# Patient Record
Sex: Male | Born: 1975 | Race: White | Hispanic: No | Smoking: Never smoker
Health system: Southern US, Community
[De-identification: ages and names within clinical notes are randomized; demographics above are authoritative.]

## PROBLEM LIST (undated history)

## (undated) DIAGNOSIS — Z86718 Personal history of other venous thrombosis and embolism: Secondary | ICD-10-CM

## (undated) DIAGNOSIS — I2699 Other pulmonary embolism without acute cor pulmonale: Secondary | ICD-10-CM

## (undated) DIAGNOSIS — D649 Anemia, unspecified: Secondary | ICD-10-CM

---

## 2013-12-30 ENCOUNTER — Emergency Department (HOSPITAL_COMMUNITY)
Admission: EM | Admit: 2013-12-30 | Discharge: 2013-12-31 | Disposition: A | Discharge status: Home / Self Care | Payer: Self-pay | Attending: Emergency Medicine | Admitting: Emergency Medicine

## 2013-12-30 ENCOUNTER — Encounter (HOSPITAL_COMMUNITY): Payer: Self-pay | Admitting: Emergency Medicine

## 2013-12-30 DIAGNOSIS — Z86718 Personal history of other venous thrombosis and embolism: Secondary | ICD-10-CM | POA: Insufficient documentation

## 2013-12-30 DIAGNOSIS — M79609 Pain in unspecified limb: Secondary | ICD-10-CM | POA: Insufficient documentation

## 2013-12-30 DIAGNOSIS — M79606 Pain in leg, unspecified: Secondary | ICD-10-CM

## 2013-12-30 DIAGNOSIS — Z862 Personal history of diseases of the blood and blood-forming organs and certain disorders involving the immune mechanism: Secondary | ICD-10-CM | POA: Insufficient documentation

## 2013-12-30 HISTORY — DX: Personal history of other venous thrombosis and embolism: Z86.718

## 2013-12-30 HISTORY — DX: Anemia, unspecified: D64.9

## 2013-12-30 LAB — CBC WITH DIFFERENTIAL/PLATELET
BASOS PCT: 0 % (ref 0–1)
Basophils Absolute: 0 10*3/uL (ref 0.0–0.1)
EOS PCT: 3 % (ref 0–5)
Eosinophils Absolute: 0.2 10*3/uL (ref 0.0–0.7)
HCT: 44.5 % (ref 39.0–52.0)
Hemoglobin: 15.4 g/dL (ref 13.0–17.0)
Lymphocytes Relative: 13 % (ref 12–46)
Lymphs Abs: 1.2 10*3/uL (ref 0.7–4.0)
MCH: 30 pg (ref 26.0–34.0)
MCHC: 34.6 g/dL (ref 30.0–36.0)
MCV: 86.6 fL (ref 78.0–100.0)
MONO ABS: 0.5 10*3/uL (ref 0.1–1.0)
Monocytes Relative: 6 % (ref 3–12)
NEUTROS ABS: 6.9 10*3/uL (ref 1.7–7.7)
Neutrophils Relative %: 78 % — ABNORMAL HIGH (ref 43–77)
PLATELETS: 301 10*3/uL (ref 150–400)
RBC: 5.14 MIL/uL (ref 4.22–5.81)
RDW: 13.2 % (ref 11.5–15.5)
WBC: 8.8 10*3/uL (ref 4.0–10.5)

## 2013-12-30 LAB — COMPREHENSIVE METABOLIC PANEL
ALBUMIN: 4 g/dL (ref 3.5–5.2)
ALT: 20 U/L (ref 0–53)
AST: 21 U/L (ref 0–37)
Alkaline Phosphatase: 76 U/L (ref 39–117)
BUN: 8 mg/dL (ref 6–23)
CO2: 27 mEq/L (ref 19–32)
CREATININE: 0.98 mg/dL (ref 0.50–1.35)
Calcium: 9.3 mg/dL (ref 8.4–10.5)
Chloride: 98 mEq/L (ref 96–112)
GFR calc Af Amer: >90 mL/min (ref >90)
GFR calc non Af Amer: >90 mL/min (ref >90)
Glucose, Bld: 101 mg/dL — ABNORMAL HIGH (ref 70–99)
Potassium: 4.2 mEq/L (ref 3.7–5.3)
SODIUM: 136 meq/L — AB (ref 137–147)
TOTAL PROTEIN: 7.1 g/dL (ref 6.0–8.3)
Total Bilirubin: 0.4 mg/dL (ref 0.3–1.2)

## 2013-12-30 LAB — PROTIME-INR
INR: 1.94 — AB (ref 0.00–1.49)
Prothrombin Time: 21.6 seconds — ABNORMAL HIGH (ref 11.6–15.2)

## 2013-12-30 MED ORDER — ENOXAPARIN SODIUM 120 MG/0.8ML ~~LOC~~ SOLN
SUBCUTANEOUS | Status: AC
  Filled 2013-12-30: qty 0.8

## 2013-12-30 MED ORDER — WARFARIN - PHYSICIAN DOSING INPATIENT: Freq: Every day | Status: DC

## 2013-12-30 MED ORDER — ENOXAPARIN SODIUM 120 MG/0.8ML ~~LOC~~ SOLN
1.0000 mg/kg | Freq: Once | SUBCUTANEOUS | Status: AC
  Administered 2013-12-31: via SUBCUTANEOUS

## 2013-12-30 MED ORDER — WARFARIN SODIUM 5 MG PO TABS
ORAL_TABLET | ORAL | Status: AC
  Filled 2013-12-30: qty 1

## 2013-12-30 MED ORDER — WARFARIN SODIUM 5 MG PO TABS
5.0000 mg | ORAL_TABLET | Freq: Once | ORAL | Status: AC
  Administered 2013-12-31: 5 mg via ORAL
  Filled 2013-12-30: qty 1

## 2013-12-30 NOTE — Discharge Instructions (Signed)
Follow up tomorrow  For ultrasound of leg.  Follow up with your md next week.

## 2013-12-30 NOTE — ED Provider Notes (Signed)
CSN: [id number]     Arrival date & time 12/30/13  2014 History  This chart was scribed for Tanner LennertJoseph L Elmore Hyslop, MD by Evon Slackerrance Branch, ED Scribe. This patient was seen in room APA14/APA14 and the patient's care was started at 9:04 PM.    Chief Complaint  Patient presents with  . Leg Swelling   Patient is a 38 y.o. male presenting with leg pain. The history is provided by the patient. No language interpreter was used.  Leg Pain Location:  Leg Injury: no   Leg location:  L lower leg (left calf) Pain details:    Quality:  Unable to specify   Radiates to:  Does not radiate   Severity:  Moderate   Onset quality:  Gradual   Duration:  1 week   Timing:  Constant   Progression:  Worsening Chronicity:  New Dislocation: no   Prior injury to area:  No Relieved by:  None tried Worsened by:  Nothing tried Ineffective treatments:  None tried Associated symptoms: swelling   Associated symptoms: no back pain and no fatigue   Risk factors comment:  H/o DVT works as [employer] architecttruck driver  HPI Comments: Tanner Boyd is a 38 y.o. male who presents to the Emergency Department complaining of left leg swelling for 1 week. 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 Pt denies any symptoms of SOB. Pt also states he has been  prescribed warfarin. He is is a current user of chewing tobacco.   Past Medical History  Diagnosis Date  . History of blood clots   . Anemia    History reviewed. No pertinent past surgical history. History reviewed. No pertinent family history. History  Substance Use Topics  . Smoking status: Never Smoker   . Smokeless tobacco: Current User    Types: Chew  . Alcohol Use: No    Review of Systems  Constitutional: Negative for appetite change and fatigue.  HENT: Negative for congestion, ear discharge and sinus pressure.   Eyes: Negative for discharge.  Respiratory: Negative for cough and shortness of breath.   Cardiovascular: Positive for leg  swelling (left). Negative for chest pain.  Gastrointestinal: Negative for abdominal pain and diarrhea.  Genitourinary: Negative for frequency and hematuria.  Musculoskeletal: Negative for back pain.  Skin: Negative for rash.  Neurological: Negative for seizures and headaches.  Psychiatric/Behavioral: Negative for hallucinations.      Allergies  Review of patient's allergies indicates no known allergies.  Home Medications   Prior to Admission medications   Not on File   Triage Vitals: BP 139/91  Pulse 78  Temp(Src) 98.2 F (36.8 C)  Resp 20  Ht 5\' 9"  (1.753 m)  Wt 240 lb (108.863 kg)  BMI 35.43 kg/m2  SpO2 100%  Physical Exam  Nursing note and vitals reviewed. Constitutional: He is oriented to person, place, and time. He appears well-developed.  HENT:  Head: Normocephalic.  Eyes: Conjunctivae and EOM are normal. No scleral icterus.  Neck: Neck supple. No thyromegaly present.  Cardiovascular: Normal rate and regular rhythm.  Exam reveals no gallop and no friction rub.   No murmur heard. Pulmonary/Chest: No stridor. He has no wheezes. He has no rales. He exhibits no tenderness.  Abdominal: He exhibits no distension. There is no tenderness. There is no rebound.  Musculoskeletal: Normal range of motion. He exhibits tenderness.  Moderate left calf tenderness, and mild swelling   Lymphadenopathy:    He has  no cervical adenopathy.  Neurological: He is oriented to person, place, and time. He exhibits normal muscle tone.  Skin: No rash noted. No erythema.  Psychiatric: He has a normal mood and affect. His behavior is normal.    ED Course  Procedures (including critical care time) DIAGNOSTIC STUDIES: Oxygen Saturation is 100% on RA, normal by my interpretation.    COORDINATION OF CARE: 9:09 PM-Discussed treatment plan which includes CBC panel, CMP, and INR with pt at bedside and pt agreed to plan.     Labs Review Labs Reviewed  CBC WITH DIFFERENTIAL - Abnormal;  Notable for the following:    Neutrophils Relative % 78 (*)    All other components within normal limits  COMPREHENSIVE METABOLIC PANEL - Abnormal; Notable for the following:    Sodium 136 (*)    Glucose, Bld 101 (*)    All other components within normal limits  PROTIME-INR - Abnormal; Notable for the following:    Prothrombin Time 21.6 (*)    INR 1.94 (*)    All other components within normal limits    Imaging Review No results found.   EKG Interpretation None      MDM   Final diagnoses:  Leg pain   The chart was scribed for me under my direct supervision.  I personally performed the history, physical, and medical decision making and all procedures in the evaluation of this patient.Tanner Lennert.    Edoardo Laforte L Hanny Elsberry, MD 12/31/13 [phone]

## 2013-12-30 NOTE — ED Notes (Signed)
Pt c/o swelling to left calf, warmth to left calf, and pain. Pt has had blood clots in the past.

## 2013-12-31 ENCOUNTER — Ambulatory Visit (HOSPITAL_COMMUNITY)
Admit: 2013-12-31 | Discharge: 2013-12-31 | Disposition: A | Discharge status: Home / Self Care | Payer: Self-pay | Source: Ambulatory Visit | Attending: Emergency Medicine | Admitting: Emergency Medicine

## 2013-12-31 DIAGNOSIS — M79609 Pain in unspecified limb: Secondary | ICD-10-CM | POA: Insufficient documentation

## 2013-12-31 NOTE — ED Provider Notes (Signed)
Doppler left lower extremity negative for DVT. Discussed with patient and his cousin  Donnetta HutchingBrian Winston Sobczyk, MD 12/31/13 [phone]

## 2014-01-31 ENCOUNTER — Encounter (HOSPITAL_COMMUNITY): Payer: Self-pay | Admitting: Emergency Medicine

## 2014-01-31 ENCOUNTER — Emergency Department (HOSPITAL_COMMUNITY)
Admission: EM | Admit: 2014-01-31 | Discharge: 2014-01-31 | Disposition: A | Discharge status: Home / Self Care | Payer: Self-pay | Attending: Emergency Medicine | Admitting: Emergency Medicine

## 2014-01-31 ENCOUNTER — Emergency Department (HOSPITAL_COMMUNITY): Payer: Self-pay

## 2014-01-31 DIAGNOSIS — Z862 Personal history of diseases of the blood and blood-forming organs and certain disorders involving the immune mechanism: Secondary | ICD-10-CM | POA: Insufficient documentation

## 2014-01-31 DIAGNOSIS — M549 Dorsalgia, unspecified: Secondary | ICD-10-CM | POA: Insufficient documentation

## 2014-01-31 DIAGNOSIS — R0600 Dyspnea, unspecified: Secondary | ICD-10-CM

## 2014-01-31 DIAGNOSIS — R0609 Other forms of dyspnea: Secondary | ICD-10-CM | POA: Insufficient documentation

## 2014-01-31 DIAGNOSIS — Z7901 Long term (current) use of anticoagulants: Secondary | ICD-10-CM | POA: Insufficient documentation

## 2014-01-31 DIAGNOSIS — R059 Cough, unspecified: Secondary | ICD-10-CM | POA: Insufficient documentation

## 2014-01-31 DIAGNOSIS — R05 Cough: Secondary | ICD-10-CM | POA: Insufficient documentation

## 2014-01-31 DIAGNOSIS — Z86711 Personal history of pulmonary embolism: Secondary | ICD-10-CM | POA: Insufficient documentation

## 2014-01-31 DIAGNOSIS — R0989 Other specified symptoms and signs involving the circulatory and respiratory systems: Principal | ICD-10-CM | POA: Insufficient documentation

## 2014-01-31 HISTORY — DX: Other pulmonary embolism without acute cor pulmonale: I26.99

## 2014-01-31 LAB — BASIC METABOLIC PANEL
BUN: 11 mg/dL (ref 6–23)
CO2: 29 meq/L (ref 19–32)
CREATININE: 1.04 mg/dL (ref 0.50–1.35)
Calcium: 9.2 mg/dL (ref 8.4–10.5)
Chloride: 100 mEq/L (ref 96–112)
GFR calc Af Amer: >90 mL/min (ref >90)
GFR calc non Af Amer: 88 mL/min — ABNORMAL LOW (ref >90)
GLUCOSE: 96 mg/dL (ref 70–99)
Potassium: 4.5 mEq/L (ref 3.7–5.3)
Sodium: 140 mEq/L (ref 137–147)

## 2014-01-31 LAB — CBC WITH DIFFERENTIAL/PLATELET
BASOS ABS: 0 10*3/uL (ref 0.0–0.1)
Basophils Relative: 0 % (ref 0–1)
Eosinophils Absolute: 0.3 10*3/uL (ref 0.0–0.7)
Eosinophils Relative: 5 % (ref 0–5)
HCT: 43.8 % (ref 39.0–52.0)
Hemoglobin: 14.8 g/dL (ref 13.0–17.0)
LYMPHS ABS: 1 10*3/uL (ref 0.7–4.0)
LYMPHS PCT: 17 % (ref 12–46)
MCH: 29.5 pg (ref 26.0–34.0)
MCHC: 33.8 g/dL (ref 30.0–36.0)
MCV: 87.3 fL (ref 78.0–100.0)
MONO ABS: 0.4 10*3/uL (ref 0.1–1.0)
Monocytes Relative: 8 % (ref 3–12)
NEUTROS ABS: 4.1 10*3/uL (ref 1.7–7.7)
Neutrophils Relative %: 70 % (ref 43–77)
Platelets: 261 10*3/uL (ref 150–400)
RBC: 5.02 MIL/uL (ref 4.22–5.81)
RDW: 13.2 % (ref 11.5–15.5)
WBC: 5.8 10*3/uL (ref 4.0–10.5)

## 2014-01-31 LAB — PROTIME-INR
INR: 2.2 — AB (ref 0.00–1.49)
Prothrombin Time: 23.7 seconds — ABNORMAL HIGH (ref 11.6–15.2)

## 2014-01-31 LAB — TROPONIN I: Troponin I: <0.3 ng/mL (ref <0.30)

## 2014-01-31 LAB — D-DIMER, QUANTITATIVE (NOT AT ARMC): D-Dimer, Quant: <0.27 ug/mL-FEU (ref 0.00–0.48)

## 2014-01-31 MED ORDER — PREDNISONE 20 MG PO TABS: ORAL_TABLET | ORAL | Status: AC

## 2014-01-31 MED ORDER — ALBUTEROL SULFATE HFA 108 (90 BASE) MCG/ACT IN AERS
2.0000 | INHALATION_SPRAY | Freq: Once | RESPIRATORY_TRACT | Status: AC
  Administered 2014-01-31: 2 via RESPIRATORY_TRACT
  Filled 2014-01-31: qty 6.7

## 2014-01-31 MED ORDER — PREDNISONE 50 MG PO TABS
60.0000 mg | ORAL_TABLET | Freq: Once | ORAL | Status: AC
  Administered 2014-01-31: 60 mg via ORAL
  Filled 2014-01-31 (×2): qty 1

## 2014-01-31 NOTE — ED Notes (Signed)
Pt c/o sob and cough x 1 week.  Pt says gets extremely sob with exertion and reports "little" chest pain.   C/O lower back pain x 2 or 3 weeks as well.   Unknown if has had fever.  Reports history of PE.

## 2014-01-31 NOTE — Care Management Note (Signed)
ED/CM noted patient did not have health insurance and/or PCP listed in the computer.  Patient was given the [place] resource handout with information on the clinics, food pantries, and the handout for new health insurance sign-up.  Patient expressed appreciation for information received. 

## 2014-01-31 NOTE — ED Provider Notes (Signed)
CSN: [id number]     Arrival date & time 01/31/14  1215 History  This chart was scribed for Donnetta HutchingBrian Gwenevere Goga, MD by Ardelia Memsylan Malpass, ED Scribe. This patient was seen in room APA05/APA05 and the patient's care was started at 1:29 PM.   Chief Complaint  Patient presents with  . Shortness of Breath  . Cough    The history is provided by the patient. No language interpreter was used.    HPI Comments: Tanner JunglingDavid Boyd is a 38 y.o. Male with a history of PE/DVTwho presents to the Emergency Department complaining of intermittent, moderate SOB over the past week. He states that his SOB is worsened with exertion. He reports associated intermittent cough, chest tightness and upper back pain over the past week. He states that he had a PE and DVT in August 2014, and that his current symptoms feel similar. He states that he has been taking Coumadin daily since August of 2014. He denies any other significant PMH other than PE/DVT. He denies any known family history of heart disease. He denies leg pain/swelling, or any other pain or symptoms.   Past Medical History  Diagnosis Date  . History of blood clots   . Anemia   . PE (pulmonary embolism)    History reviewed. No pertinent past surgical history. No family history on file. History  Substance Use Topics  . Smoking status: Never Smoker   . Smokeless tobacco: Current User    Types: Chew  . Alcohol Use: No    Review of Systems A complete 10 system review of systems was obtained and all systems are negative except as noted in the HPI and PMH.   Allergies  Review of patient's allergies indicates no known allergies.  Home Medications   Prior to Admission medications   Medication Sig Start Date End Date Taking? Authorizing Provider  warfarin (COUMADIN) 5 MG tablet Take 5 mg by mouth daily.    Historical Provider, MD   Triage Vitals: BP 136/92  Pulse 85  Temp(Src) 97.9 F (36.6 C) (Oral)  Resp 18  SpO2 98%  Physical Exam  Nursing note and vitals  reviewed. Constitutional: He is oriented to person, place, and time. He appears well-developed and well-nourished.  HENT:  Head: Normocephalic and atraumatic.  Eyes: Conjunctivae and EOM are normal. Pupils are equal, round, and reactive to light.  Neck: Normal range of motion. Neck supple.  Cardiovascular: Normal rate, regular rhythm and normal heart sounds.   Pulmonary/Chest: Effort normal and breath sounds normal.  Abdominal: Soft. Bowel sounds are normal.  Musculoskeletal: Normal range of motion.  Neurological: He is alert and oriented to person, place, and time.  Skin: Skin is warm and dry.  Psychiatric: He has a normal mood and affect. His behavior is normal.    ED Course  Procedures (including critical care time)  DIAGNOSTIC STUDIES: Oxygen Saturation is 98% on RA, normal by my interpretation.    COORDINATION OF CARE: 1:32 PM- Discussed plan to obtain a CXR and blood work. Pt advised of plan for treatment and pt agrees.  Results for orders placed during the hospital encounter of 01/31/14  CBC WITH DIFFERENTIAL      Result Value Ref Range   WBC 5.8  4.0 - 10.5 K/uL   RBC 5.02  4.22 - 5.81 MIL/uL   Hemoglobin 14.8  13.0 - 17.0 g/dL   HCT 40.943.8  81.139.0 - 91.452.0 %   MCV 87.3  78.0 - 100.0 fL   MCH 29.5  26.0 -  34.0 pg   MCHC 33.8  30.0 - 36.0 g/dL   RDW 78.2  95.6 - 21.3 %   Platelets 261  150 - 400 K/uL   Neutrophils Relative % 70  43 - 77 %   Neutro Abs 4.1  1.7 - 7.7 K/uL   Lymphocytes Relative 17  12 - 46 %   Lymphs Abs 1.0  0.7 - 4.0 K/uL   Monocytes Relative 8  3 - 12 %   Monocytes Absolute 0.4  0.1 - 1.0 K/uL   Eosinophils Relative 5  0 - 5 %   Eosinophils Absolute 0.3  0.0 - 0.7 K/uL   Basophils Relative 0  0 - 1 %   Basophils Absolute 0.0  0.0 - 0.1 K/uL  BASIC METABOLIC PANEL      Result Value Ref Range   Sodium 140  137 - 147 mEq/L   Potassium 4.5  3.7 - 5.3 mEq/L   Chloride 100  96 - 112 mEq/L   CO2 29  19 - 32 mEq/L   Glucose, Bld 96  70 - 99 mg/dL   BUN  11  6 - 23 mg/dL   Creatinine, Ser 0.86  0.50 - 1.35 mg/dL   Calcium 9.2  8.4 - 57.8 mg/dL   GFR calc non Af Amer 88 (*) >90 mL/min   GFR calc Af Amer >90  >90 mL/min  D-DIMER, QUANTITATIVE      Result Value Ref Range   D-Dimer, Quant <0.27  0.00 - 0.48 ug/mL-FEU  TROPONIN I      Result Value Ref Range   Troponin I <0.30  <0.30 ng/mL  PROTIME-INR      Result Value Ref Range   Prothrombin Time 23.7 (*) 11.6 - 15.2 seconds   INR 2.20 (*) 0.00 - 1.49   Imaging Review Dg Chest 2 View  01/31/2014   CLINICAL DATA:  Shortness of breath with cough for 1 week. Low back pain.  EXAM: CHEST  2 VIEW  COMPARISON:  None.  FINDINGS: The heart size and mediastinal contours are normal aside from a small hiatal hernia. The lungs are clear. There is no pleural effusion or pneumothorax. The lungs do appear mildly hyperinflated. The osseous structures appear normal. Telemetry leads overlie the chest.  IMPRESSION: No acute cardiopulmonary process. Suspected mild chronic obstructive pulmonary disease. Small hiatal hernia.   Electronically Signed   By: Roxy Horseman M.D.   On: 01/31/2014 13:12     EKG Interpretation   Date/Time:  Wednesday Jan 31 2014 12:39:50 EDT Ventricular Rate:  84 PR Interval:  167 QRS Duration: 85 QT Interval:  380 QTC Calculation: 449 R Axis:   47 Text Interpretation:  Sinus rhythm Baseline wander in lead(s) I II III aVR  aVL aVF V2 V4 V5 V6 Confirmed by Maxcine Strong  MD, Toni Hoffmeister ([id number]) on 01/31/2014  5:04:39 PM      MDM   Final diagnoses:  Dyspnea    Patient appears well. Normal vital signs. Normal oxygenation. INR within reasonable range. D-dimer negative. Troponin negative EKG normal.  Patient is overweight and appears deconditioned. Rx albuterol inhaler and prednisone. He will followup at [facility]   I personally performed the services described in this documentation, which was scribed in my presence. The recorded information has been reviewed and is  accurate.   Donnetta Hutching, MD 01/31/14 1705

## 2014-01-31 NOTE — Discharge Instructions (Signed)
Use your inhaler 2 puffs every 3 hours. Start prescription for prednisone tomorrow. Tests here were normal. Continue Coumadin. Recommend following at [facility] Department for blood work

## 2015-03-22 IMAGING — CR DG CHEST 2V
2 series · 2 of 2 positions shown · non-contrast
Comparison: None.

CLINICAL DATA: Shortness of breath with cough for 1 week. Low back
pain.

EXAM:
CHEST  2 VIEW

[view not recorded (1 of 2)]
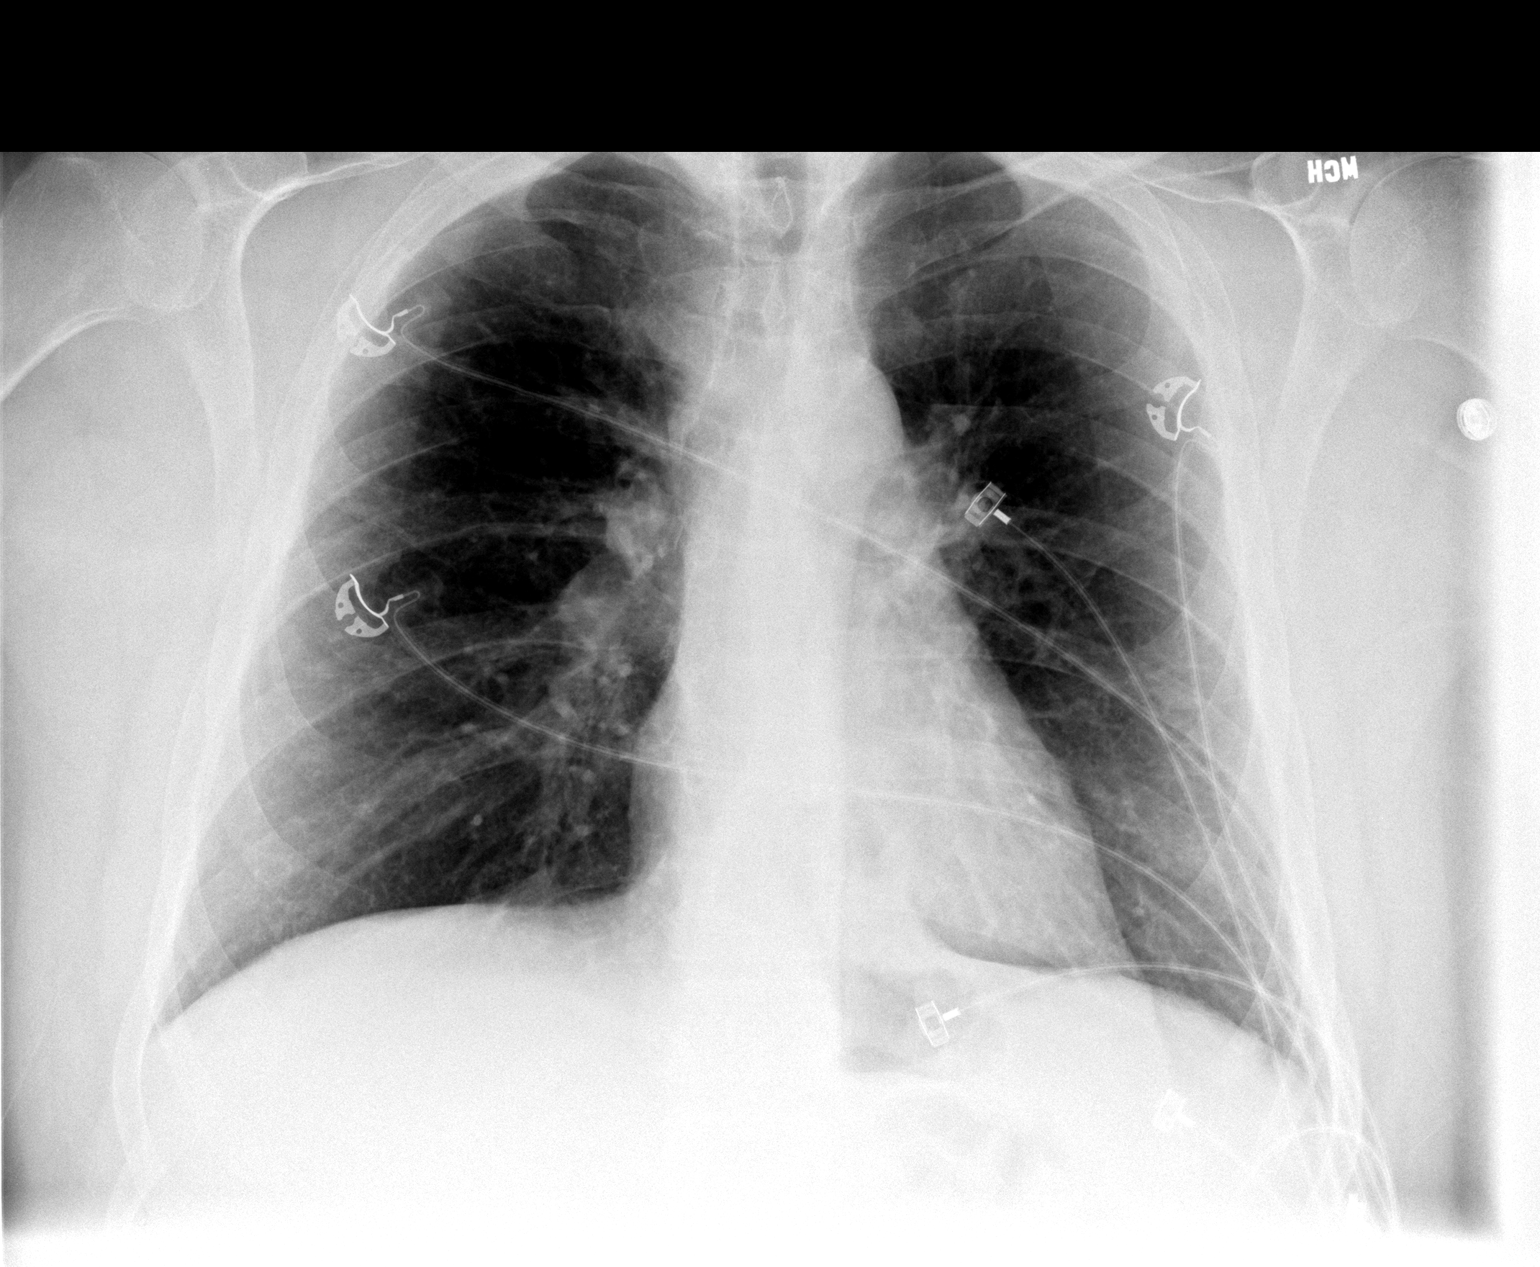

[view not recorded (2 of 2)]
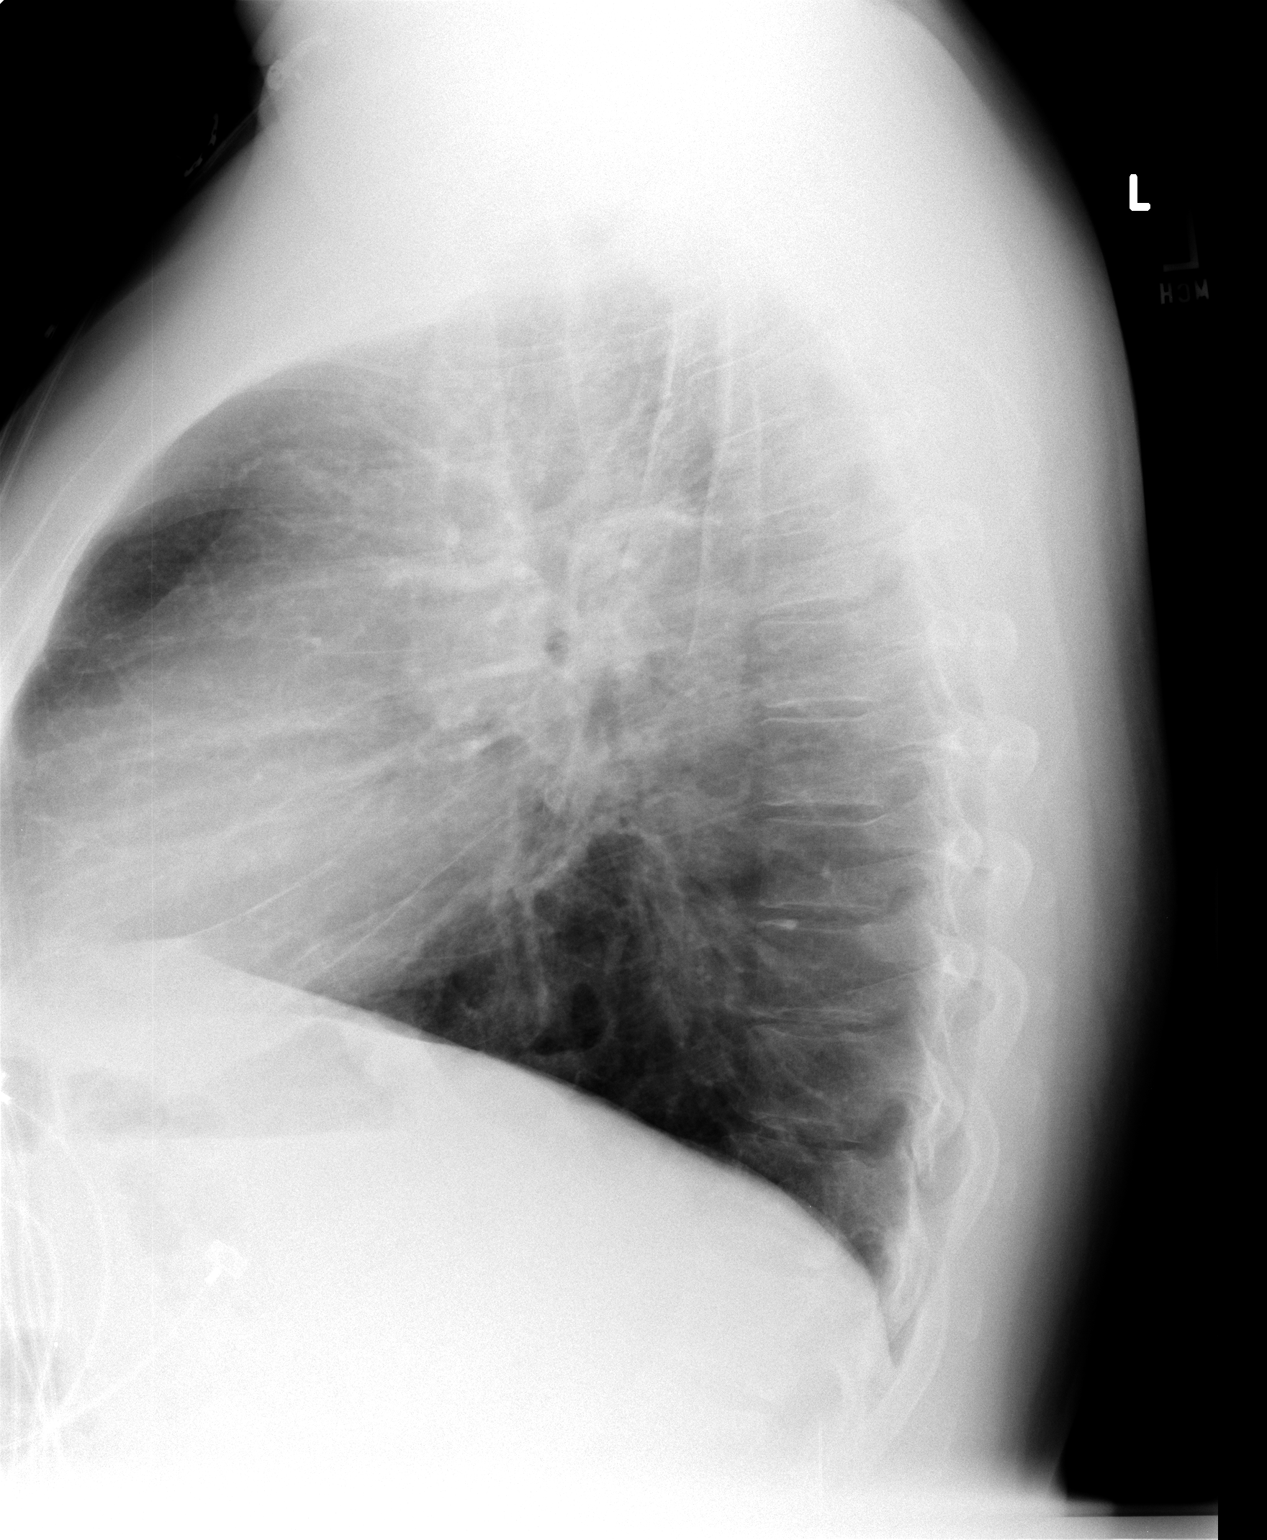

[2 of 2 positions shown; findings below may reference images not displayed]

FINDINGS: The heart size and mediastinal contours are normal aside from a
small hiatal hernia. The lungs are clear. There is no pleural
effusion or pneumothorax. The lungs do appear mildly hyperinflated.
The osseous structures appear normal. Telemetry leads overlie the
chest.
IMPRESSION: No acute cardiopulmonary process. Suspected mild chronic obstructive
pulmonary disease. Small hiatal hernia.

## 2015-06-11 ENCOUNTER — Other Ambulatory Visit (HOSPITAL_COMMUNITY): Payer: Self-pay | Admitting: *Deleted

## 2015-06-11 ENCOUNTER — Other Ambulatory Visit (HOSPITAL_COMMUNITY): Payer: Self-pay | Admitting: Oncology

## 2015-06-11 DIAGNOSIS — Z86711 Personal history of pulmonary embolism: Secondary | ICD-10-CM
